# Patient Record
Sex: Male | Born: 1990 | Race: White | Marital: Married | Smoking: Never smoker
Health system: Southern US, Community
[De-identification: ages and names within clinical notes are randomized; demographics above are authoritative.]

## PROBLEM LIST (undated history)

## (undated) DIAGNOSIS — D696 Thrombocytopenia, unspecified: Secondary | ICD-10-CM

## (undated) HISTORY — PX: ANKLE SURGERY: SHX546

---

## 2019-12-24 ENCOUNTER — Other Ambulatory Visit: Payer: Self-pay | Admitting: Orthopaedic Surgery

## 2019-12-24 DIAGNOSIS — M19072 Primary osteoarthritis, left ankle and foot: Secondary | ICD-10-CM

## 2019-12-24 DIAGNOSIS — M25572 Pain in left ankle and joints of left foot: Secondary | ICD-10-CM

## 2019-12-25 ENCOUNTER — Ambulatory Visit
Admission: RE | Admit: 2019-12-25 | Discharge: 2019-12-25 | Disposition: A | Discharge status: Home / Self Care | Payer: 59 | Source: Ambulatory Visit | Attending: Orthopaedic Surgery | Admitting: Orthopaedic Surgery

## 2019-12-25 ENCOUNTER — Other Ambulatory Visit: Payer: Self-pay

## 2019-12-25 DIAGNOSIS — M25572 Pain in left ankle and joints of left foot: Secondary | ICD-10-CM

## 2019-12-25 DIAGNOSIS — M19072 Primary osteoarthritis, left ankle and foot: Secondary | ICD-10-CM

## 2020-01-13 ENCOUNTER — Other Ambulatory Visit: Payer: Self-pay

## 2020-01-13 ENCOUNTER — Other Ambulatory Visit: Payer: Self-pay | Admitting: Orthopaedic Surgery

## 2020-01-13 ENCOUNTER — Encounter (HOSPITAL_BASED_OUTPATIENT_CLINIC_OR_DEPARTMENT_OTHER): Payer: Self-pay | Admitting: Orthopaedic Surgery

## 2020-01-14 ENCOUNTER — Other Ambulatory Visit (HOSPITAL_COMMUNITY)
Admission: RE | Admit: 2020-01-14 | Discharge: 2020-01-14 | Disposition: A | Discharge status: Home / Self Care | Payer: Commercial Managed Care - PPO | Source: Ambulatory Visit | Attending: Orthopaedic Surgery | Admitting: Orthopaedic Surgery

## 2020-01-14 ENCOUNTER — Encounter (HOSPITAL_BASED_OUTPATIENT_CLINIC_OR_DEPARTMENT_OTHER)
Admission: RE | Admit: 2020-01-14 | Discharge: 2020-01-14 | Disposition: A | Discharge status: Home / Self Care | Payer: Commercial Managed Care - PPO | Source: Ambulatory Visit | Attending: Orthopaedic Surgery | Admitting: Orthopaedic Surgery

## 2020-01-14 DIAGNOSIS — Z01812 Encounter for preprocedural laboratory examination: Secondary | ICD-10-CM | POA: Diagnosis not present

## 2020-01-14 DIAGNOSIS — X58XXXA Exposure to other specified factors, initial encounter: Secondary | ICD-10-CM | POA: Diagnosis not present

## 2020-01-14 DIAGNOSIS — M25772 Osteophyte, left ankle: Secondary | ICD-10-CM | POA: Diagnosis not present

## 2020-01-14 DIAGNOSIS — Z20822 Contact with and (suspected) exposure to covid-19: Secondary | ICD-10-CM | POA: Insufficient documentation

## 2020-01-14 DIAGNOSIS — S92142A Displaced dome fracture of left talus, initial encounter for closed fracture: Secondary | ICD-10-CM | POA: Diagnosis not present

## 2020-01-14 DIAGNOSIS — Z886 Allergy status to analgesic agent status: Secondary | ICD-10-CM | POA: Diagnosis not present

## 2020-01-14 DIAGNOSIS — M899 Disorder of bone, unspecified: Secondary | ICD-10-CM | POA: Diagnosis not present

## 2020-01-14 LAB — CBC
HCT: 46.1 % (ref 39.0–52.0)
Hemoglobin: 15.6 g/dL (ref 13.0–17.0)
MCH: 28.8 pg (ref 26.0–34.0)
MCHC: 33.8 g/dL (ref 30.0–36.0)
MCV: 85.1 fL (ref 80.0–100.0)
Platelets: 177 10*3/uL (ref 150–400)
RBC: 5.42 MIL/uL (ref 4.22–5.81)
RDW: 12.2 % (ref 11.5–15.5)
WBC: 6.9 10*3/uL (ref 4.0–10.5)
nRBC: 0 % (ref 0.0–0.2)

## 2020-01-14 LAB — SARS CORONAVIRUS 2 (TAT 6-24 HRS): SARS Coronavirus 2: NEGATIVE

## 2020-01-14 NOTE — Anesthesia Preprocedure Evaluation (Addendum)
Anesthesia Evaluation  Patient identified by MRN, date of birth, ID band Patient awake    Reviewed: Allergy & Precautions, NPO status , Patient's Chart, lab work & pertinent test results  Airway Mallampati: II  TM Distance: >3 FB Neck ROM: Full    Dental no notable dental hx. (+) Teeth Intact, Dental Advisory Given   Pulmonary neg pulmonary ROS,    Pulmonary exam normal breath sounds clear to auscultation       Cardiovascular Exercise Tolerance: Good negative cardio ROS Normal cardiovascular exam Rhythm:Regular Rate:Normal     Neuro/Psych negative neurological ROS  negative psych ROS   GI/Hepatic negative GI ROS, Neg liver ROS,   Endo/Other  negative endocrine ROS  Renal/GU negative Renal ROS     Musculoskeletal negative musculoskeletal ROS (+)   Abdominal   Peds  Hematology negative hematology ROS (+)   Anesthesia Other Findings   Reproductive/Obstetrics                            Anesthesia Physical Anesthesia Plan  ASA: I  Anesthesia Plan: General   Post-op Pain Management:  Regional for Post-op pain   Induction:   PONV Risk Score and Plan: 3 and Treatment may vary due to age or medical condition, Ondansetron, Dexamethasone and Midazolam  Airway Management Planned: LMA  Additional Equipment: None  Intra-op Plan:   Post-operative Plan:   Informed Consent: I have reviewed the patients History and Physical, chart, labs and discussed the procedure including the risks, benefits and alternatives for the proposed anesthesia with the patient or authorized representative who has indicated his/her understanding and acceptance.     Dental advisory given  Plan Discussed with: CRNA and Anesthesiologist  Anesthesia Plan Comments: (LMA + L  popliteal block + L adductor)      Anesthesia Quick Evaluation

## 2020-01-14 NOTE — Progress Notes (Signed)

## 2020-01-15 ENCOUNTER — Encounter (HOSPITAL_BASED_OUTPATIENT_CLINIC_OR_DEPARTMENT_OTHER): Payer: Self-pay | Admitting: Orthopaedic Surgery

## 2020-01-15 ENCOUNTER — Ambulatory Visit (HOSPITAL_BASED_OUTPATIENT_CLINIC_OR_DEPARTMENT_OTHER): Payer: Commercial Managed Care - PPO | Admitting: Anesthesiology

## 2020-01-15 ENCOUNTER — Other Ambulatory Visit: Payer: Self-pay

## 2020-01-15 ENCOUNTER — Encounter (HOSPITAL_BASED_OUTPATIENT_CLINIC_OR_DEPARTMENT_OTHER)
Admission: RE | Disposition: A | Discharge status: Home / Self Care | Payer: Self-pay | Source: Home / Self Care | Attending: Orthopaedic Surgery

## 2020-01-15 ENCOUNTER — Ambulatory Visit (HOSPITAL_BASED_OUTPATIENT_CLINIC_OR_DEPARTMENT_OTHER)
Admission: RE | Admit: 2020-01-15 | Discharge: 2020-01-15 | Disposition: A | Discharge status: Home / Self Care | Payer: Commercial Managed Care - PPO | Attending: Orthopaedic Surgery | Admitting: Orthopaedic Surgery

## 2020-01-15 DIAGNOSIS — S92142A Displaced dome fracture of left talus, initial encounter for closed fracture: Secondary | ICD-10-CM | POA: Insufficient documentation

## 2020-01-15 DIAGNOSIS — M899 Disorder of bone, unspecified: Secondary | ICD-10-CM | POA: Insufficient documentation

## 2020-01-15 DIAGNOSIS — X58XXXA Exposure to other specified factors, initial encounter: Secondary | ICD-10-CM | POA: Insufficient documentation

## 2020-01-15 DIAGNOSIS — M25772 Osteophyte, left ankle: Secondary | ICD-10-CM | POA: Insufficient documentation

## 2020-01-15 DIAGNOSIS — Z886 Allergy status to analgesic agent status: Secondary | ICD-10-CM | POA: Insufficient documentation

## 2020-01-15 HISTORY — PX: ORIF ANKLE FRACTURE: SHX5408

## 2020-01-15 HISTORY — DX: Thrombocytopenia, unspecified: D69.6

## 2020-01-15 SURGERY — OPEN REDUCTION INTERNAL FIXATION (ORIF) ANKLE FRACTURE
Anesthesia: General | Site: Ankle | Laterality: Left

## 2020-01-15 MED ORDER — MIDAZOLAM HCL 2 MG/2ML IJ SOLN
2.0000 mg | Freq: Once | INTRAMUSCULAR | Status: AC
  Administered 2020-01-15: 12:00:00 2 mg via INTRAVENOUS

## 2020-01-15 MED ORDER — OXYCODONE HCL 5 MG/5ML PO SOLN: 5.0000 mg | Freq: Once | ORAL | Status: DC | PRN

## 2020-01-15 MED ORDER — DEXAMETHASONE SODIUM PHOSPHATE 10 MG/ML IJ SOLN
INTRAMUSCULAR | Status: DC | PRN
  Administered 2020-01-15: 10 mg via INTRAVENOUS

## 2020-01-15 MED ORDER — LACTATED RINGERS IV SOLN: INTRAVENOUS | Status: DC

## 2020-01-15 MED ORDER — FENTANYL CITRATE (PF) 100 MCG/2ML IJ SOLN
100.0000 ug | Freq: Once | INTRAMUSCULAR | Status: AC
  Administered 2020-01-15: 12:00:00 100 ug via INTRAVENOUS

## 2020-01-15 MED ORDER — FENTANYL CITRATE (PF) 100 MCG/2ML IJ SOLN
INTRAMUSCULAR | Status: AC
  Filled 2020-01-15: qty 2

## 2020-01-15 MED ORDER — ONDANSETRON HCL 4 MG/2ML IJ SOLN
INTRAMUSCULAR | Status: AC
  Filled 2020-01-15: qty 2

## 2020-01-15 MED ORDER — ACETAMINOPHEN 10 MG/ML IV SOLN: 1000.0000 mg | Freq: Once | INTRAVENOUS | Status: DC | PRN

## 2020-01-15 MED ORDER — OXYCODONE HCL 5 MG PO TABS: 5.0000 mg | ORAL_TABLET | ORAL | 0 refills | Status: AC | PRN

## 2020-01-15 MED ORDER — GLYCOPYRROLATE PF 0.2 MG/ML IJ SOSY
PREFILLED_SYRINGE | INTRAMUSCULAR | Status: AC
  Filled 2020-01-15: qty 1

## 2020-01-15 MED ORDER — CLONIDINE HCL (ANALGESIA) 100 MCG/ML EP SOLN
EPIDURAL | Status: DC | PRN
  Administered 2020-01-15: 100 ug

## 2020-01-15 MED ORDER — ARTIFICIAL TEARS OPHTHALMIC OINT
TOPICAL_OINTMENT | OPHTHALMIC | Status: AC
  Filled 2020-01-15: qty 3.5

## 2020-01-15 MED ORDER — FENTANYL CITRATE (PF) 100 MCG/2ML IJ SOLN
INTRAMUSCULAR | Status: DC | PRN
  Administered 2020-01-15: 100 ug via INTRAVENOUS

## 2020-01-15 MED ORDER — MIDAZOLAM HCL 2 MG/2ML IJ SOLN
INTRAMUSCULAR | Status: AC
  Filled 2020-01-15: qty 2

## 2020-01-15 MED ORDER — MIDAZOLAM HCL 2 MG/2ML IJ SOLN
INTRAMUSCULAR | Status: DC | PRN
  Administered 2020-01-15: 2 mg via INTRAVENOUS

## 2020-01-15 MED ORDER — LIDOCAINE HCL (CARDIAC) PF 100 MG/5ML IV SOSY
PREFILLED_SYRINGE | INTRAVENOUS | Status: DC | PRN
  Administered 2020-01-15: 100 mg via INTRATRACHEAL

## 2020-01-15 MED ORDER — LIDOCAINE 2% (20 MG/ML) 5 ML SYRINGE
INTRAMUSCULAR | Status: AC
  Filled 2020-01-15: qty 5

## 2020-01-15 MED ORDER — CEFAZOLIN SODIUM-DEXTROSE 2-4 GM/100ML-% IV SOLN
2.0000 g | INTRAVENOUS | Status: AC
  Administered 2020-01-15: 13:00:00 2 g via INTRAVENOUS

## 2020-01-15 MED ORDER — PROPOFOL 10 MG/ML IV BOLUS
INTRAVENOUS | Status: AC
  Filled 2020-01-15: qty 20

## 2020-01-15 MED ORDER — OXYCODONE HCL 5 MG PO TABS: 5.0000 mg | ORAL_TABLET | Freq: Once | ORAL | Status: DC | PRN

## 2020-01-15 MED ORDER — ONDANSETRON HCL 4 MG/2ML IJ SOLN: 4.0000 mg | Freq: Once | INTRAMUSCULAR | Status: DC | PRN

## 2020-01-15 MED ORDER — ROPIVACAINE HCL 5 MG/ML IJ SOLN
INTRAMUSCULAR | Status: DC | PRN
  Administered 2020-01-15: 10 mL via PERINEURAL

## 2020-01-15 MED ORDER — EPHEDRINE SULFATE 50 MG/ML IJ SOLN
INTRAMUSCULAR | Status: DC | PRN
  Administered 2020-01-15 (×2): 10 mg via INTRAVENOUS

## 2020-01-15 MED ORDER — ONDANSETRON HCL 4 MG/2ML IJ SOLN
INTRAMUSCULAR | Status: DC | PRN
  Administered 2020-01-15: 4 mg via INTRAVENOUS

## 2020-01-15 MED ORDER — HYDROMORPHONE HCL 1 MG/ML IJ SOLN: 0.2500 mg | INTRAMUSCULAR | Status: DC | PRN

## 2020-01-15 MED ORDER — DEXAMETHASONE SODIUM PHOSPHATE 10 MG/ML IJ SOLN
INTRAMUSCULAR | Status: AC
  Filled 2020-01-15: qty 1

## 2020-01-15 MED ORDER — PROPOFOL 10 MG/ML IV BOLUS
INTRAVENOUS | Status: DC | PRN
  Administered 2020-01-15: 200 mg via INTRAVENOUS

## 2020-01-15 MED ORDER — ROPIVACAINE HCL 7.5 MG/ML IJ SOLN
INTRAMUSCULAR | Status: DC | PRN
  Administered 2020-01-15: 20 mL via PERINEURAL

## 2020-01-15 MED ORDER — CEFAZOLIN SODIUM-DEXTROSE 2-4 GM/100ML-% IV SOLN
INTRAVENOUS | Status: AC
  Filled 2020-01-15: qty 100

## 2020-01-15 MED ORDER — MEPERIDINE HCL 25 MG/ML IJ SOLN: 6.2500 mg | INTRAMUSCULAR | Status: DC | PRN

## 2020-01-15 MED ORDER — GLYCOPYRROLATE 0.2 MG/ML IJ SOLN
INTRAMUSCULAR | Status: DC | PRN
  Administered 2020-01-15: .2 mg via INTRAVENOUS

## 2020-01-15 SURGICAL SUPPLY — 63 items
ANCH SUT 0 2DMD BSUT TK 14.5X3 (Anchor) ×1 IMPLANT
ANCHOR BIO-SUTURETAK 0 FWIRE (Anchor) ×2 IMPLANT
APL PRP STRL LF DISP 70% ISPRP (MISCELLANEOUS) ×1
APL SKNCLS STERI-STRIP NONHPOA (GAUZE/BANDAGES/DRESSINGS)
BANDAGE ESMARK 6X9 LF (GAUZE/BANDAGES/DRESSINGS) ×1 IMPLANT
BENZOIN TINCTURE PRP APPL 2/3 (GAUZE/BANDAGES/DRESSINGS) IMPLANT
BLADE SURG 15 STRL LF DISP TIS (BLADE) ×2 IMPLANT
BLADE SURG 15 STRL SS (BLADE) ×4
BNDG CMPR 9X6 STRL LF SNTH (GAUZE/BANDAGES/DRESSINGS) ×1
BNDG COHESIVE 4X5 TAN STRL (GAUZE/BANDAGES/DRESSINGS) IMPLANT
BNDG ELASTIC 6X5.8 VLCR STR LF (GAUZE/BANDAGES/DRESSINGS) ×4 IMPLANT
BNDG ESMARK 6X9 LF (GAUZE/BANDAGES/DRESSINGS) ×2
CHLORAPREP W/TINT 26 (MISCELLANEOUS) ×2 IMPLANT
COVER BACK TABLE 60X90IN (DRAPES) ×2 IMPLANT
COVER WAND RF STERILE (DRAPES) IMPLANT
CUFF TOURN SGL QUICK 34 (TOURNIQUET CUFF) ×2
CUFF TRNQT CYL 34X4.125X (TOURNIQUET CUFF) ×1 IMPLANT
DECANTER SPIKE VIAL GLASS SM (MISCELLANEOUS) IMPLANT
DRAPE C-ARM 42X72 X-RAY (DRAPES) IMPLANT
DRAPE C-ARMOR (DRAPES) IMPLANT
DRAPE EXTREMITY T 121X128X90 (DISPOSABLE) ×2 IMPLANT
DRAPE IMP U-DRAPE 54X76 (DRAPES) ×2 IMPLANT
DRAPE U-SHAPE 47X51 STRL (DRAPES) ×2 IMPLANT
ELECT REM PT RETURN 9FT ADLT (ELECTROSURGICAL) ×2
ELECTRODE REM PT RTRN 9FT ADLT (ELECTROSURGICAL) ×1 IMPLANT
GAUZE SPONGE 4X4 12PLY STRL (GAUZE/BANDAGES/DRESSINGS) ×2 IMPLANT
GAUZE XEROFORM 1X8 LF (GAUZE/BANDAGES/DRESSINGS) ×2 IMPLANT
GLOVE SRG 8 PF TXTR STRL LF DI (GLOVE) ×1 IMPLANT
GLOVE SURG ENC TEXT LTX SZ7.5 (GLOVE) ×2 IMPLANT
GLOVE SURG UNDER POLY LF SZ8 (GLOVE) ×2
GOWN STRL REUS W/ TWL LRG LVL3 (GOWN DISPOSABLE) ×1 IMPLANT
GOWN STRL REUS W/ TWL XL LVL3 (GOWN DISPOSABLE) ×1 IMPLANT
GOWN STRL REUS W/TWL LRG LVL3 (GOWN DISPOSABLE) ×2
GOWN STRL REUS W/TWL XL LVL3 (GOWN DISPOSABLE) ×2
KIT SUTURETAK 2.4 DRILL BIT (KITS) ×2 IMPLANT
NS IRRIG 1000ML POUR BTL (IV SOLUTION) ×2 IMPLANT
PACK BASIN DAY SURGERY FS (CUSTOM PROCEDURE TRAY) ×2 IMPLANT
PAD CAST 4YDX4 CTTN HI CHSV (CAST SUPPLIES) ×1 IMPLANT
PADDING CAST COTTON 4X4 STRL (CAST SUPPLIES) ×2
PADDING CAST SYNTHETIC 4 (CAST SUPPLIES) ×2
PADDING CAST SYNTHETIC 4X4 STR (CAST SUPPLIES) ×2 IMPLANT
PENCIL SMOKE EVACUATOR (MISCELLANEOUS) ×2 IMPLANT
SHEET MEDIUM DRAPE 40X70 STRL (DRAPES) ×2 IMPLANT
SLEEVE SCD COMPRESS KNEE MED (MISCELLANEOUS) ×2 IMPLANT
SPLINT FAST PLASTER 5X30 (CAST SUPPLIES) ×20
SPLINT PLASTER CAST FAST 5X30 (CAST SUPPLIES) ×20 IMPLANT
SPONGE LAP 18X18 RF (DISPOSABLE) IMPLANT
STAPLER VISISTAT 35W (STAPLE) IMPLANT
STOCKINETTE 6  STRL (DRAPES) ×1
STOCKINETTE 6 STRL (DRAPES) ×1 IMPLANT
STRIP CLOSURE SKIN 1/2X4 (GAUZE/BANDAGES/DRESSINGS) IMPLANT
SUCTION FRAZIER HANDLE 10FR (MISCELLANEOUS) ×2
SUCTION TUBE FRAZIER 10FR DISP (MISCELLANEOUS) ×2 IMPLANT
SUT ETHILON 3 0 PS 1 (SUTURE) ×2 IMPLANT
SUT MNCRL AB 3-0 PS2 18 (SUTURE) ×2 IMPLANT
SUT PDS AB 2-0 CT2 27 (SUTURE) ×2 IMPLANT
SUT VIC AB 2-0 SH 27 (SUTURE) ×2
SUT VIC AB 2-0 SH 27XBRD (SUTURE) ×1 IMPLANT
SUT VIC AB 3-0 FS2 27 (SUTURE) IMPLANT
SYR BULB EAR ULCER 3OZ GRN STR (SYRINGE) ×2 IMPLANT
TOWEL GREEN STERILE FF (TOWEL DISPOSABLE) ×4 IMPLANT
TUBE CONNECTING 20X1/4 (TUBING) ×2 IMPLANT
UNDERPAD 30X36 HEAVY ABSORB (UNDERPADS AND DIAPERS) ×2 IMPLANT

## 2020-01-15 NOTE — Anesthesia Procedure Notes (Signed)
Procedure Name: LMA Insertion Date/Time: 01/15/2020 1:00 PM Performed by: Salomon Mast, CRNA Pre-anesthesia Checklist: Patient identified, Emergency Drugs available, Suction available and Patient being monitored Patient Re-evaluated:Patient Re-evaluated prior to induction Oxygen Delivery Method: Circle system utilized Preoxygenation: Pre-oxygenation with 100% oxygen Induction Type: IV induction LMA: LMA inserted LMA Size: 4.0 Number of attempts: 1 Placement Confirmation: positive ETCO2 and breath sounds checked- equal and bilateral Tube secured with: Tape

## 2020-01-15 NOTE — H&P (Signed)
PREOPERATIVE H&P  Chief Complaint: Left ankle pain  HPI: Lee Hartman is a 29 y.o. male who presents for preoperative history and physical with a diagnosis of lateral process of the talus fracture with continued pain.  Also had some anteromedial impingement with osteophytes of the distal tibia and dorsal talus.  He is here today for surgical correction of these problems.  He failed conservative treatment with physical therapy which exacerbated his symptoms, boot immobilization, anti-inflammatories and activity modifications.  He had continued pain.  He did undergo lateral ligament reconstruction ankle scope and did quite well from this.  During his rehab process he was noted to have worsening pain.. Symptoms are rated as moderate to severe, and have been worsening.  This is significantly impairing activities of daily living.  He has elected for surgical management.   Past Medical History:  Diagnosis Date   Thrombocytopenia (HCC)    Past Surgical History:  Procedure Laterality Date   ANKLE SURGERY     Social History   Socioeconomic History   Marital status: Married    Spouse name: Not on file   Number of children: Not on file   Years of education: Not on file   Highest education level: Not on file  Occupational History   Not on file  Tobacco Use   Smoking status: Never Smoker   Smokeless tobacco: Never Used  Vaping Use   Vaping Use: Never used  Substance and Sexual Activity   Alcohol use: Never   Drug use: Never   Sexual activity: Not on file  Other Topics Concern   Not on file  Social History Narrative   Not on file   Social Determinants of Health   Financial Resource Strain: Not on file  Food Insecurity: Not on file  Transportation Needs: Not on file  Physical Activity: Not on file  Stress: Not on file  Social Connections: Not on file   History reviewed. No pertinent family history. Allergies  Allergen Reactions   Aspirin Other (See Comments)    H/o  thrombocytopenia, avoids NSAIDS also   Prior to Admission medications   Medication Sig Start Date End Date Taking? Authorizing Provider  montelukast (SINGULAIR) 10 MG tablet Take 10 mg by mouth at bedtime.   Yes [provider]     Positive ROS: All other systems have been reviewed and were otherwise negative with the exception of those mentioned in the HPI and as above.  Physical Exam:  Vitals:   01/15/20 1225 01/15/20 1230  BP: 103/61 114/65  Pulse: 60 63  Resp: 15 18  Temp:    SpO2: 100% 100%   General: Alert, no acute distress Cardiovascular: No pedal edema Respiratory: No cyanosis, no use of accessory musculature GI: No organomegaly, abdomen is soft and non-tender Skin: No lesions in the area of chief complaint Neurologic: Sensation intact distally Psychiatric: Patient is competent for consent with normal mood and affect Lymphatic: No axillary or cervical lymphadenopathy  MUSCULOSKELETAL: Left ankle demonstrates no swelling.  He has tenderness palpation of the anteromedial ankle joint.  Also has some tenderness to palpation distal to the fibula about the lateral process of the talus area.  He has good ankle motion.  Good ankle stability.  Forced dorsiflexion causes pain in the anteromedial ankle.  Some lateral motion of the hindfoot causes discomfort laterally.  Foot is warm and well-perfused.  Intact sensation distally.  Assessment: Left lateral talar process fracture Anteromedial ankle impingement with dorsal talar osteophyte and distal medial tibia  osteophyte   Plan: Plan for open treatment of his talus fracture with internal fixation versus resection of the fragment Ankle arthrotomy and removal of medial distal tibial osteophyte and dorsal talar osteophyte..  We discussed the risks, benefits and alternatives of surgery which include but are not limited to wound healing complications, infection, nonunion, malunion, need for further surgery, damage to  surrounding structures and continued pain.  They understand there is no guarantees to an acceptable outcome.  After weighing these risks they opted to proceed with surgery.     Terance Hart, MD    01/15/2020 12:45 PM

## 2020-01-15 NOTE — Anesthesia Procedure Notes (Signed)
Anesthesia Regional Block: Popliteal block   Pre-Anesthetic Checklist: ,, timeout performed, Correct Patient, Correct Site, Correct Laterality, Correct Procedure, Correct Position, site marked, Risks and benefits discussed, pre-op evaluation,  At surgeon's request and post-op pain management  Laterality: Left and Lower  Prep: Maximum Sterile Barrier Precautions used, chloraprep       Needles:  Injection technique: Single-shot  Needle Type: Echogenic Needle     Needle Length: 9cm  Needle Gauge: 21     Additional Needles:   Procedures:,,,, ultrasound used (permanent image in chart),,,,  Narrative:  Start time: 01/15/2020 12:15 PM End time: 01/15/2020 12:22 PM Injection made incrementally with aspirations every 5 mL.  Performed by: Personally  Anesthesiologist: Trevor Iha, MD  Additional Notes: Block assessed. Patient tolerated procedure well.

## 2020-01-15 NOTE — Discharge Instructions (Signed)
DR. Susa Simmonds FOOT [facility] ANKLE SURGERY POST-OP INSTRUCTIONS   Pain Management 1. The numbing medicine and your leg will last around 18 hours, take a dose of your pain medicine as soon as you feel it wearing off to avoid rebound pain. 2. Keep your foot elevated above heart level.  Make sure that your heel hangs free ('floats'). 3. Take all prescribed medication as directed. 4. If taking narcotic pain medication you may want to use an over-the-counter stool softener to avoid constipation. 5. You may take over-the-counter NSAIDs (ibuprofen, naproxen, etc.) as well as over-the-counter acetaminophen as directed on the packaging as a supplement for your pain and may also use it to wean away from the prescription medication.  Activity ? Weightbearing as tolerated  ? Keep dressing intact  First Postoperative Visit 1. Your first postop visit will be at least 2 weeks after surgery.  This should be scheduled when you schedule surgery. 2. If you do not have a postoperative visit scheduled please call [phone] to schedule an appointment. 3. At the appointment your incision will be evaluated for suture removal, x-rays will be obtained if necessary.  General Instructions 1. Swelling is very common after foot and ankle surgery.  It often takes 3 months for the foot and ankle to begin to feel comfortable.  Some amount of swelling will persist for 6-12 months. 2. DO NOT change the dressing.  If there is a problem with the dressing (too tight, loose, gets wet, etc.) please contact Dr. Donnie Mesa office. 3. DO NOT get the dressing wet.  For showers you can use an over-the-counter cast cover or wrap a washcloth around the top of your dressing and then cover it with a plastic bag and tape it to your leg. 4. DO NOT soak the incision (no tubs, pools, bath, etc.) until you have approval from Dr. Susa Simmonds.  Contact Dr. Garret Reddish office or go to Emergency Room if: 1. Temperature above 101 F. 2. Increasing pain that is  unresponsive to pain medication or elevation 3. Excessive redness or swelling in your foot 4. Dressing problems - excessive bloody drainage, looseness or tightness, or if dressing gets wet 5. Develop pain, swelling, warmth, or discoloration of your calf   Post Anesthesia Home Care Instructions  Activity: Get plenty of rest for the remainder of the day. A responsible individual must stay with you for 24 hours following the procedure.  For the next 24 hours, DO NOT: -Drive a car -Advertising copywriter -Drink alcoholic beverages -Take any medication unless instructed by your physician -Make any legal decisions or sign important papers.  Meals: Start with liquid foods such as gelatin or soup. Progress to regular foods as tolerated. Avoid greasy, spicy, heavy foods. If nausea and/or vomiting occur, drink only clear liquids until the nausea and/or vomiting subsides. Call your physician if vomiting continues.  Special Instructions/Symptoms: Your throat may feel dry or sore from the anesthesia or the breathing tube placed in your throat during surgery. If this causes discomfort, gargle with warm salt water. The discomfort should disappear within 24 hours.  If you had a scopolamine patch placed behind your ear for the management of post- operative nausea and/or vomiting:  1. The medication in the patch is effective for 72 hours, after which it should be removed.  Wrap patch in a tissue and discard in the trash. Wash hands thoroughly with soap and water. 2. You may remove the patch earlier than 72 hours if you experience unpleasant side effects which may include  dry mouth, dizziness or visual disturbances. 3. Avoid touching the patch. Wash your hands with soap and water after contact with the patch.    Regional Anesthesia Blocks  1. Numbness or the inability to move the "blocked" extremity may last from 3-48 hours after placement. The length of time depends on the medication injected and your  individual response to the medication. If the numbness is not going away after 48 hours, call your surgeon.  2. The extremity that is blocked will need to be protected until the numbness is gone and the  Strength has returned. Because you cannot feel it, you will need to take extra care to avoid injury. Because it may be weak, you may have difficulty moving it or using it. You may not know what position it is in without looking at it while the block is in effect.  3. For blocks in the legs and feet, returning to weight bearing and walking needs to be done carefully. You will need to wait until the numbness is entirely gone and the strength has returned. You should be able to move your leg and foot normally before you try and bear weight or walk. You will need someone to be with you when you first try to ensure you do not fall and possibly risk injury.  4. Bruising and tenderness at the needle site are common side effects and will resolve in a few days.  5. Persistent numbness or new problems with movement should be communicated to the surgeon or the [facility] [phone] [facility] [phone]).

## 2020-01-15 NOTE — Anesthesia Procedure Notes (Signed)
Anesthesia Regional Block: Adductor canal block   Pre-Anesthetic Checklist: ,, timeout performed, Correct Patient, Correct Site, Correct Laterality, Correct Procedure, Correct Position, site marked, Risks and benefits discussed,  Surgical consent,  Pre-op evaluation,  At surgeon's request and post-op pain management  Laterality: Lower and Left  Prep: chloraprep       Needles:  Injection technique: Single-shot  Needle Type: Echogenic Needle     Needle Length: 9cm  Needle Gauge: 22     Additional Needles:   Procedures:,,,, ultrasound used (permanent image in chart),,,,  Narrative:  Start time: 01/15/2020 12:22 PM End time: 01/15/2020 12:29 PM Injection made incrementally with aspirations every 5 mL.  Performed by: Personally  Anesthesiologist: Trevor Iha, MD  Additional Notes: Block assessed prior to surgery. Pt tolerated procedure well.

## 2020-01-15 NOTE — Anesthesia Postprocedure Evaluation (Signed)
Anesthesia Post Note  Patient: Lee Hartman  Procedure(s) Performed: OPEN TREATMENT OF LEFT TALUS FRACTURE, RESECTION OF TIBIA AND TALUS (Left Ankle)     Patient location during evaluation: PACU Anesthesia Type: General Level of consciousness: awake and alert and oriented Pain management: pain level controlled Vital Signs Assessment: post-procedure vital signs reviewed and stable Respiratory status: spontaneous breathing, nonlabored ventilation and respiratory function stable Cardiovascular status: blood pressure returned to baseline and stable Postop Assessment: no apparent nausea or vomiting Anesthetic complications: no   No complications documented.  Last Vitals:  Vitals:   01/15/20 1415 01/15/20 1430  BP: (!) 102/59 (!) 108/50  Pulse: 62 64  Resp: 13 15  Temp:    SpO2: 100% 94%    Last Pain:  Vitals:   01/15/20 1430  TempSrc:   PainSc: 0-No pain                 Sanyiah Kanzler A.

## 2020-01-15 NOTE — Transfer of Care (Signed)
Immediate Anesthesia Transfer of Care Note  Patient: Lee Hartman  Procedure(s) Performed: OPEN TREATMENT OF LEFT TALUS FRACTURE, RESECTION OF TIBIA AND TALUS (Left Ankle)  Patient Location: PACU  Anesthesia Type:General and Regional  Level of Consciousness: awake and patient cooperative  Airway & Oxygen Therapy: Patient Spontanous Breathing and Patient connected to face mask oxygen  Post-op Assessment: Report given to RN, Post -op Vital signs reviewed and stable, Patient moving all extremities X 4 and Patient able to stick tongue midline  Post vital signs: Reviewed and stable  Last Vitals:  Vitals Value Taken Time  BP    Temp    Pulse 71 01/15/20 1412  Resp 11 01/15/20 1412  SpO2 99 % 01/15/20 1412  Vitals shown include unvalidated device data.  Last Pain:  Vitals:   01/15/20 1207  TempSrc: Oral  PainSc: 0-No pain      Patients Stated Pain Goal: 4 (01/15/20 1207)  Complications: No complications documented.

## 2020-01-15 NOTE — Progress Notes (Signed)
AssistedDr. Houser with left, ultrasound guided, popliteal, adductor canal block. Side rails up, monitors on throughout procedure. See vital signs in flow sheet. Tolerated Procedure well.  

## 2020-01-15 NOTE — Op Note (Signed)
Lee Hartman male 29 y.o. 01/15/2020  PreOperative Diagnosis: Left subacute lateral talar fracture Anteromedial ankle impingement with synovitis Anteromedial distal tibial exostosis Dorsal talar neck exostosis  PostOperative Diagnosis: Same  PROCEDURE: Open treatment of left talus fracture Ankle arthrotomy and synovectomy Partial excision of distal tibia Partial excision of talus  SURGEON: Dub Mikes, MD  ASSISTANT: None  ANESTHESIA: General LMA with peripheral nerve blockade  FINDINGS: See below  IMPLANTS: Arthrex suture tack  INDICATIONS:29 y.o. male sustained an ankle injury greater than 1 year ago.  He had continued pain and underwent lateral ligament reconstructive surgery.  He did well with this but during rehab developed pain on the lateral aspect of his foot.  CT scan revealed a subacute fracture of his talus bone.  He was having pain in this area with hindfoot inversion and eversion.  He was also having pain in the anteromedial aspect of his ankle consistent with anteromedial impingement and had evidence of osteophyte formation of the distal medial tibia and dorsal talar neck at the level of his pain.  Given failure of conservative treatment he was indicated for surgery as above.   Patient understood the risks, benefits and alternatives to surgery which include but are not limited to wound healing complications, infection, nonunion, malunion, need for further surgery as well as damage to surrounding structures. They also understood the potential for continued pain in that there were no guarantees of acceptable outcome After weighing these risks the patient opted to proceed with surgery.  PROCEDURE: Patient was identified in the preoperative holding area.  The left leg was marked by myself.  Consent was signed by myself and the patient.  Block was performed by anesthesia in the preoperative holding area.  Patient was taken to the operative suite and placed supine on  the operative table.  General LMA anesthesia was induced without difficulty. Bump was placed under the operative hip and bone foam was used.  All bony prominences were well padded.  Tourniquet was placed on the operative thigh.  Preoperative antibiotics were given. The extremity was prepped and draped in the usual sterile fashion and surgical timeout was performed.  The limb was elevated and the tourniquet was inflated to 250 mmHg.  We began by making a longitudinal incision just distal to the prior incision made for his lateral ligament reconstruction.  This taken sharply down through skin and subcutaneous tissue.  Blunt dissection was used to mobilize skin flaps.  The extensor digitorum brevis muscle belly was identified.  The fascia overlying the muscle belly was incised in line with the incision.  The level of the talus fracture was identified and an incision was made through the deep tissue to gain access to this portion of the talus.  Blunt dissection was used to mobilize the tissue overlying the fracture.  The fracture site was identified.  The soft tissue was mobilized overlying the fracture site.  The fracture fragment was rather small and therefore it was decided for excision of the piece rather than internal fixation.  The fracture fragment was further mobilized and elevated from the underlying and overlying soft tissue.  The fracture fragment was ultimately removed.  This area fracture was further inspected and there was some ectopic and heterotopic bone in this area that was cleared off with a rondure to allow for smooth ankle motion.  The ankle was taken through range of motion to ensure no bony impingement in this area.  There was none.  Then the lateral ligaments that previously  been repaired were tied back to the area of the talus fracture using a suture tack.  A drill tunnel was made and the tack was placed within the lateral aspect of the talus at the insertion site of the ATFL.  The tendons  were tied back to the bone.  There is no gross instability at any point.  We then turned our attention to the medial ankle.  An incision was made overlying the medial ankle joint.  Was taken sharply down through skin and subcutaneous tissue and was made just medial to the tibialis anterior tendon.  The tendon was mobilized.  The tissue medial to the tibialis anterior was incised in line with the incision to gain access to the joint capsule.  The ankle joint capsule was identified and an arthrotomy was created in the anteromedial ankle joint.  There is some synovial fluid that exuded.  The soft tissue was mobilized further to gain access to the anterior portion of the distal tibia and anteromedial aspect of the distal tibia.  There was severe scarring and synovitic tissue in this area within the ankle joint.  This synovectomy was performed with sharp dissection using a 15 blade and pickups.  The ankle was then taken through range of motion and was found to be bony impingement of the distal medial aspect of the tibia and talar neck.  Using an osteotome partial resection of the distal tibia was performed.  This was done to the anterior distal aspect of the tibia from the medial malleolus over to the central area of the joint.  Then the fragments were removed with a rondure.  Then the ankle was taken through range of motion and there was noted to be continued prominence of the dorsal talus.  Using a rondure partial resection of the dorsal talar neck region was performed.  The bony prominence was removed and smoothed back to normal anatomic contour.  The ankle was then taken through range of motion and noted to be without evidence of any bony or soft tissue impingement in the anteromedial ankle joint.  The ankle was then stressed again and found to be grossly stable.  The ankle joint was irrigated copiously with normal saline.  The wounds were then irrigated copiously with normal saline.  The ankle joint capsule  and deep tissue was closed using a 2-0 Vicryl suture.  The skin and subcuticular tissue was closed in a layered fashion using 3-0 Monocryl and 3-0 nylon.  Soft dressing was placed.  Tourniquet was released.  He was awakened from anesthesia and taken recovery in stable condition.  There are no complications.    POST OPERATIVE INSTRUCTIONS: Weightbearing as tolerated operative extremity Wear walking boot as needed for pain Follow-up in 2 weeks for suture removal.  No x-rays needed. Call the office with concerns.  TOURNIQUET TIME: Less than 2 hours  BLOOD LOSS:  Minimal         DRAINS: none         SPECIMEN: none       COMPLICATIONS:  * No complications entered in OR log *         Disposition: PACU - hemodynamically stable.         Condition: stable

## 2020-01-19 ENCOUNTER — Encounter (HOSPITAL_BASED_OUTPATIENT_CLINIC_OR_DEPARTMENT_OTHER): Payer: Self-pay | Admitting: Orthopaedic Surgery

## 2020-01-19 NOTE — Addendum Note (Signed)
Addendum  created 01/19/20 0802 by Kellina Dreese, Jewel Baize, CRNA   Charge Capture section accepted

## 2021-11-29 IMAGING — CT CT ANKLE*L* W/O CM
2 series · 13 of 29 positions shown, 16 images · non-contrast
Comparison: MRI left ankle dated June 23, 2019. Left ankle x-rays
dated March 28, 2019.

CLINICAL DATA: Chronic anterior ankle pain.  Prior surgery in [REDACTED].

EXAM:
CT OF THE LEFT ANKLE WITHOUT CONTRAST
TECHNIQUE: Multidetector CT imaging of the left ankle was performed according
to the standard protocol. Multiplanar CT image reconstructions were
also generated.

[Series 4: soft tissue lower extremity · axial · 0.32mm/px · z∈[-257,-71]mm · 8 of 111 slices shown, 10 images]
[im 9/111  soft-tissue]
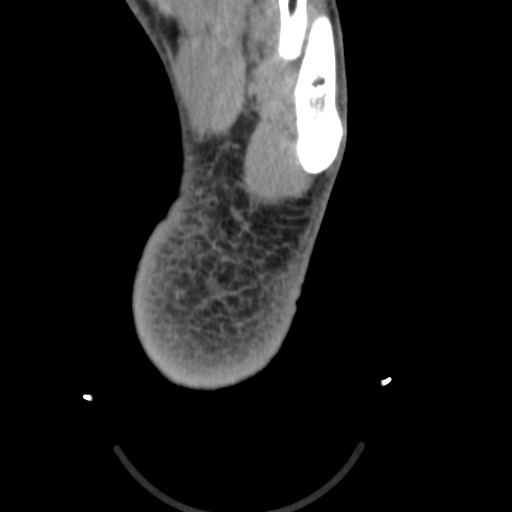
[im 9/111  bone]
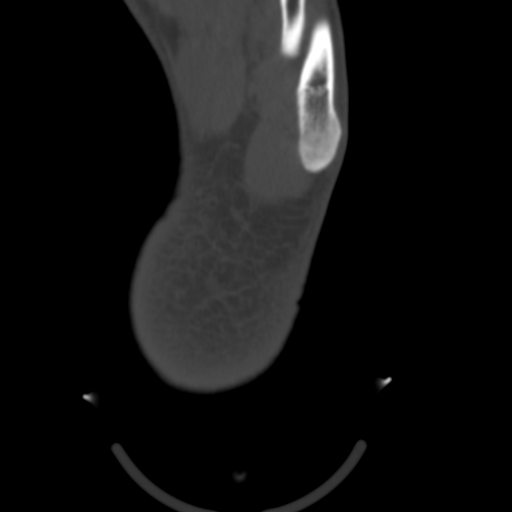
[im 26/111  bone]
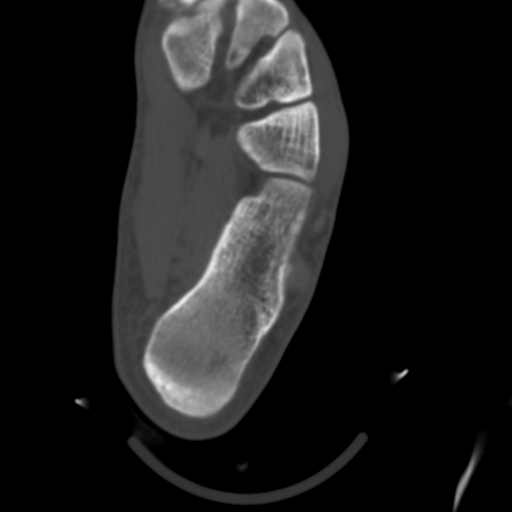
[im 34/111  bone]
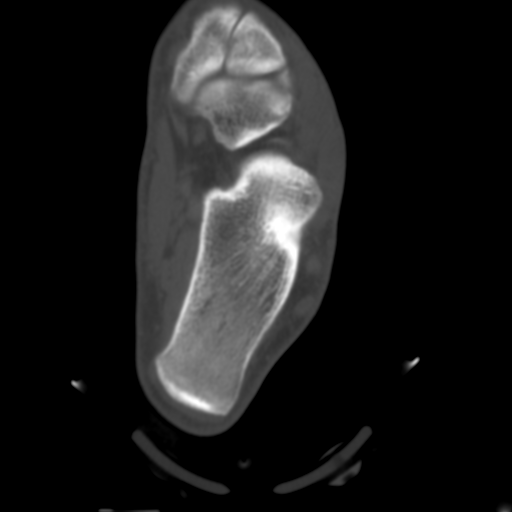
[im 51/111  bone]
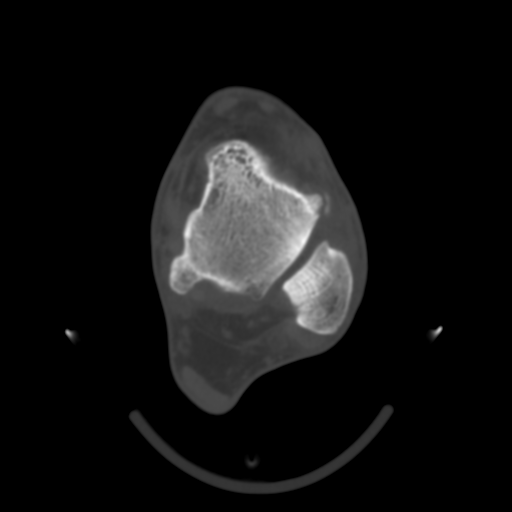
[im 60/111  soft-tissue]
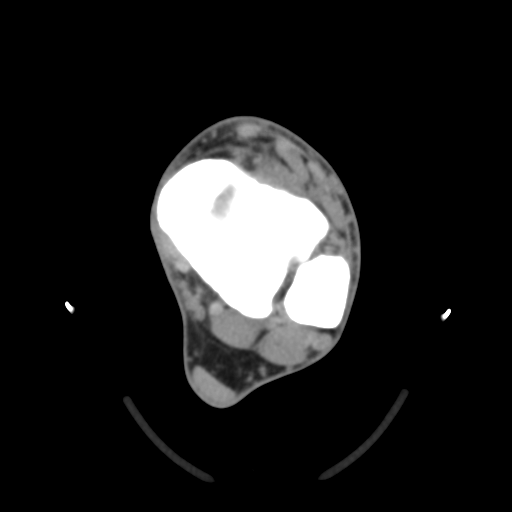
[im 60/111  bone]
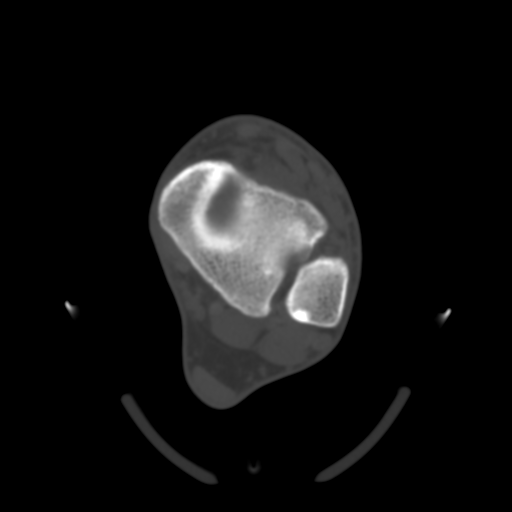
[im 77/111  bone]
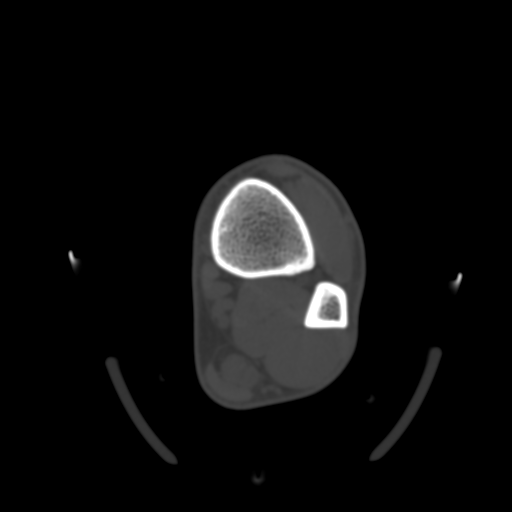
[im 85/111  bone]
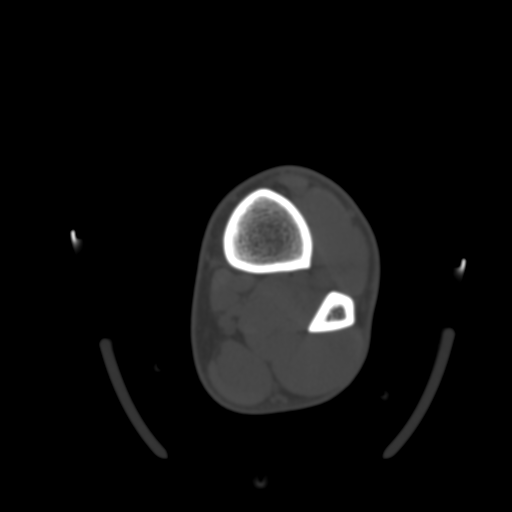
[im 102/111  bone]
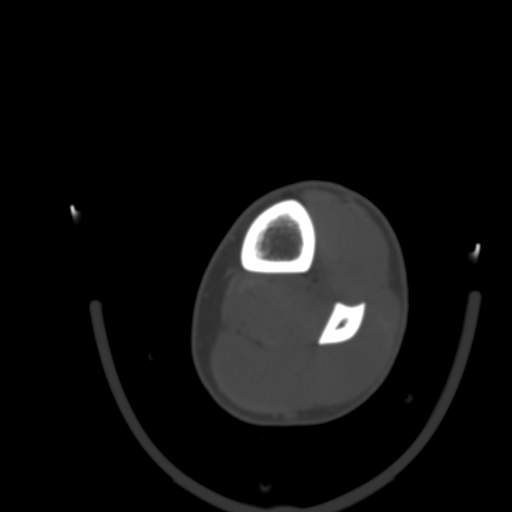

[Series 10: sagsoft tissue · sagittal · 0.29mm/px · 5 of 77 slices shown, 6 images]
[im 26/77  bone]
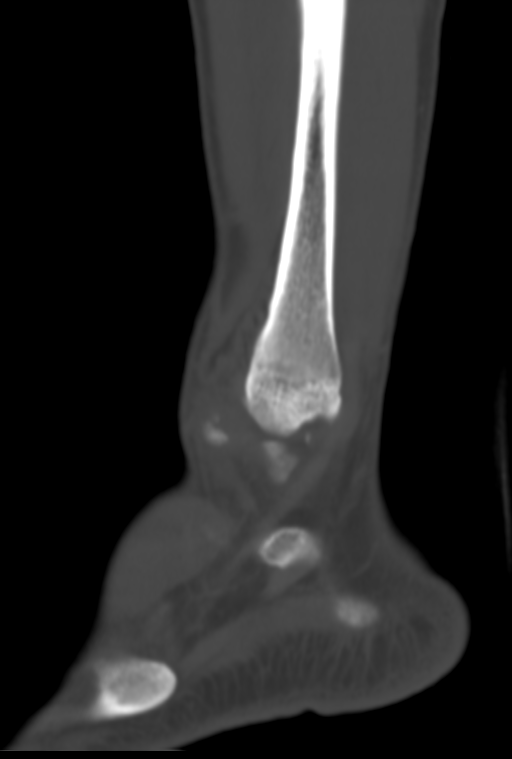
[im 32/77  bone]
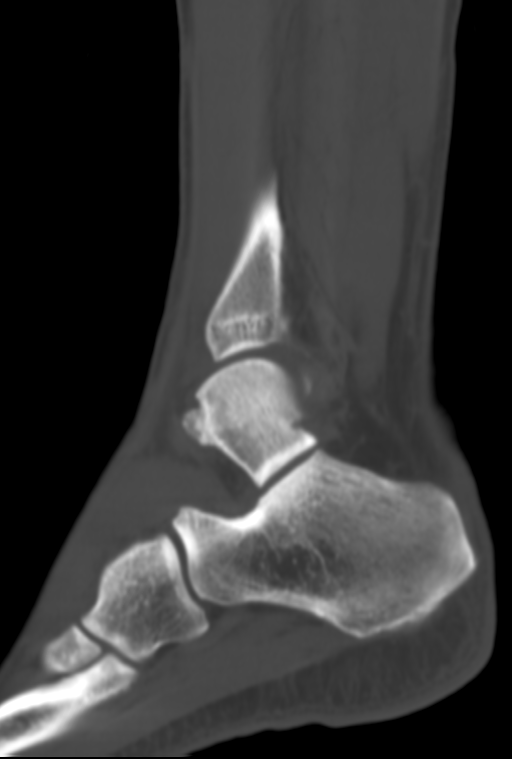
[im 39/77  soft-tissue]
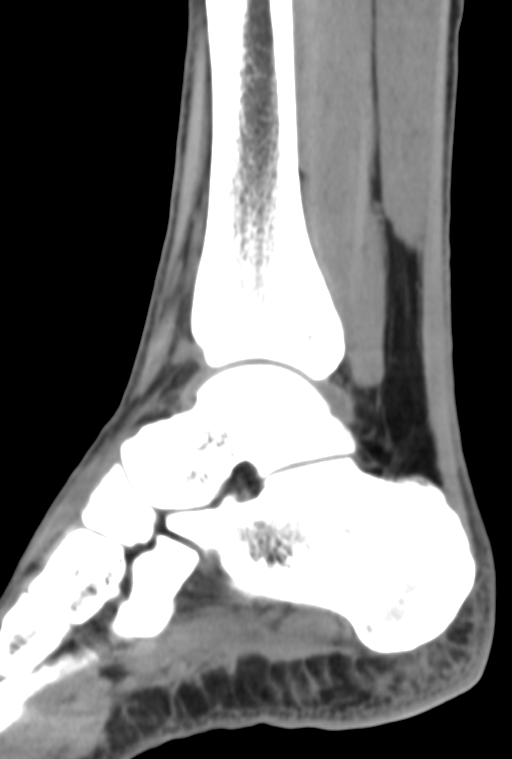
[im 39/77  bone]
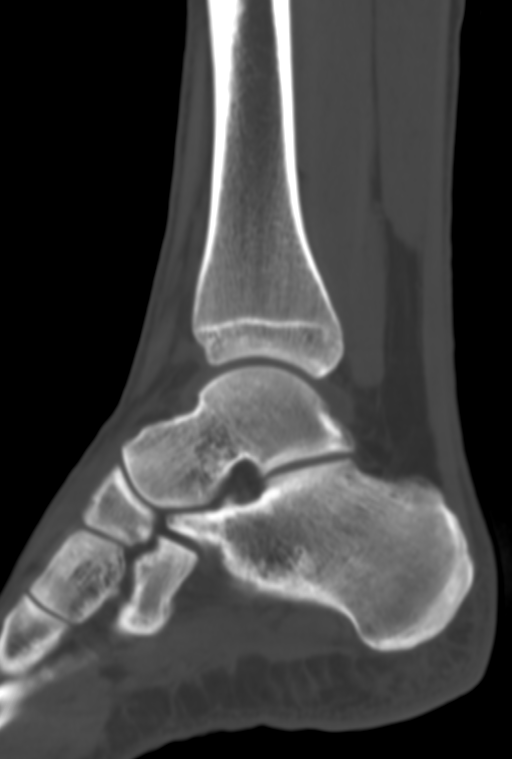
[im 45/77  bone]
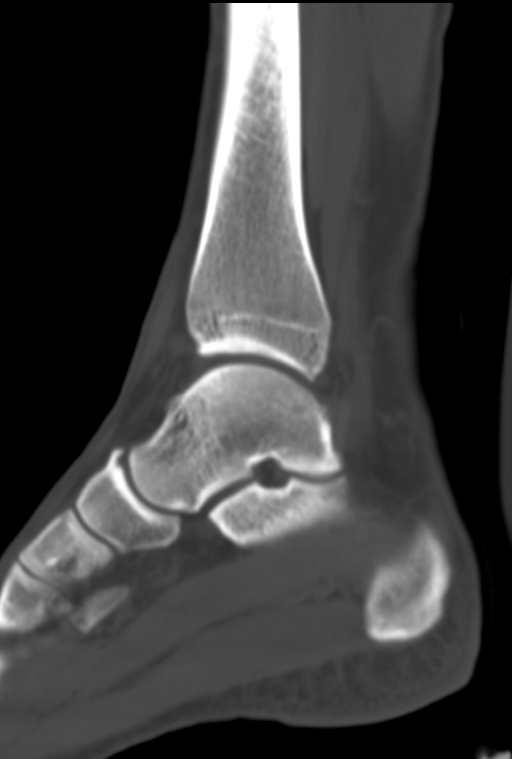
[im 51/77  bone]
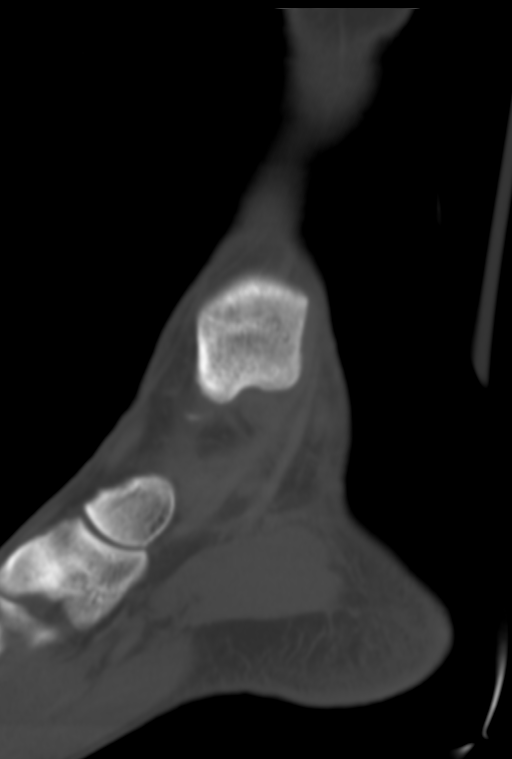

[13 of 29 positions shown; findings below may reference images not displayed]

FINDINGS: Bones/Joint/Cartilage

Chronic avulsion fracture of the lateral talus with heterotopic
ossification in the (series 3, image 64). No acute fracture or
dislocation. The ankle mortise is symmetric. The talar dome is
intact. Joint spaces are preserved. Suture anchor in the anterior
tip of the lateral malleolus. No joint effusion.

Ligaments

Ligaments are suboptimally evaluated by CT, although the anterior
talofibular ligament appears thickened as on prior MRI.

Muscles and Tendons
Grossly intact.  No muscle atrophy.

Soft tissue
No fluid collection or hematoma.  No soft tissue mass.
IMPRESSION: 1. Chronic avulsion fracture of the lateral talus at the anterior
talofibular ligament attachment with heterotopic ossification.

## 2023-01-17 ENCOUNTER — Encounter: Payer: Self-pay | Admitting: Emergency Medicine

## 2023-01-17 ENCOUNTER — Ambulatory Visit
Admission: EM | Admit: 2023-01-17 | Discharge: 2023-01-17 | Disposition: A | Discharge status: Home / Self Care | Payer: Commercial Managed Care - PPO | Attending: Internal Medicine | Admitting: Internal Medicine

## 2023-01-17 DIAGNOSIS — J0100 Acute maxillary sinusitis, unspecified: Secondary | ICD-10-CM

## 2023-01-17 MED ORDER — METHYLPREDNISOLONE ACETATE 80 MG/ML IJ SUSP
40.0000 mg | Freq: Once | INTRAMUSCULAR | Status: AC
  Administered 2023-01-17: 40 mg via INTRAMUSCULAR

## 2023-01-17 MED ORDER — AZITHROMYCIN 250 MG PO TABS: ORAL_TABLET | ORAL | 0 refills | Status: AC

## 2023-01-17 NOTE — ED Provider Notes (Signed)
 [facility]  Note:  This document was prepared using Dragon voice recognition software and may include unintentional dictation errors.  MRN: [id number] DOB: Apr 11, 1990 DATE: 01/17/23   Subjective:  Chief Complaint:  Chief Complaint  Patient presents with   Cough   Sinus Congestion    HPI: Lee Hartman is a 33 y.o. male presenting for sinus congestion and sinus pain for the past week and a half. Patient states he started with a scratchy throat and cough over a week ago. He thought he was improving until 5 days ago he started feeling worse. He reports increased sinus pressure and pain. He states he is consistently expressing colored nasal discharge throughout the day. Cough has improved. He has not taken anything for his symptoms. Denies fever, nausea/vomiting, abdominal pain, sore throat, otalgia. Endorses cough, congestion, sinus pressure/pain. Presents NAD.  Prior to Admission medications   Medication Sig Start Date End Date Taking? Authorizing Provider  montelukast (SINGULAIR) 10 MG tablet Take 10 mg by mouth at bedtime.    [provider]     Allergies  Allergen Reactions   Other Other (See Comments)    Blood platelet dissorder   Aspirin Other (See Comments)    H/o thrombocytopenia, avoids NSAIDS also    History:   Past Medical History:  Diagnosis Date   Thrombocytopenia (HCC)      Past Surgical History:  Procedure Laterality Date   ANKLE SURGERY     ORIF ANKLE FRACTURE Left 01/15/2020   Procedure: OPEN TREATMENT OF LEFT TALUS FRACTURE, RESECTION OF TIBIA AND TALUS;  Surgeon: Elsa Lonni SAUNDERS, MD;  Location: [facility];  Service: Orthopedics;  Laterality: Left;  LENGTH OF SURGERY: 90 MIN    Family History  Problem Relation Age of Onset   Migraines Mother    Healthy Father     Social History   Tobacco Use   Smoking status: Never   Smokeless tobacco: Never  Vaping Use   Vaping status: Never Used  Substance Use Topics    Alcohol use: Never   Drug use: Never    Review of Systems  Constitutional:  Negative for fever.  HENT:  Positive for congestion, rhinorrhea, sinus pressure and sinus pain. Negative for ear pain and sore throat.   Respiratory:  Positive for cough.   Gastrointestinal:  Negative for abdominal pain, nausea and vomiting.     Objective:   Vitals: BP 96/64 (BP Location: Right Arm)   Pulse (!) 51   Temp 97.6 F (36.4 C) (Oral)   Resp 18   SpO2 98%   Physical Exam Constitutional:      General: He is not in acute distress.    Appearance: Normal appearance. He is well-developed and normal weight. He is not ill-appearing or toxic-appearing.  HENT:     Head: Normocephalic and atraumatic.     Right Ear: A middle ear effusion is present.     Left Ear: A middle ear effusion is present.     Nose: Rhinorrhea present. Rhinorrhea is clear.     Right Turbinates: Enlarged.     Left Turbinates: Enlarged.     Right Sinus: Maxillary sinus tenderness present.     Left Sinus: Maxillary sinus tenderness present.     Mouth/Throat:     Pharynx: Oropharynx is clear. Uvula midline. No pharyngeal swelling, oropharyngeal exudate or posterior oropharyngeal erythema.     Tonsils: No tonsillar exudate or tonsillar abscesses.  Cardiovascular:     Rate and Rhythm: Normal rate  and regular rhythm.     Heart sounds: Normal heart sounds.  Pulmonary:     Effort: Pulmonary effort is normal.     Breath sounds: Normal breath sounds.     Comments: Clear to auscultation bilaterally  Abdominal:     General: Bowel sounds are normal.     Palpations: Abdomen is soft.     Tenderness: There is no abdominal tenderness.  Skin:    General: Skin is warm and dry.  Neurological:     General: No focal deficit present.     Mental Status: He is alert.  Psychiatric:        Mood and Affect: Mood and affect normal.     Results:  Labs: No results found for this or any previous visit (from the past 24  hours).  Radiology: No results found.   UC Course/Treatments:  Procedures: Procedures   Medications Ordered in UC: Medications  methylPREDNISolone  acetate (DEPO-MEDROL ) injection 40 mg (has no administration in time range)     Assessment and Plan :     ICD-10-CM   1. Acute non-recurrent maxillary sinusitis  J01.00      Acute Non-Recurrent Maxillary Sinusitis Afebrile, nontoxic-appearing, NAD. VSS. DDX includes but not limited to: COVID, flu, viral URI, sinusitis, allergic rhinitis Given worsening sinus pain/congestion, Zithromax  250mg  as directed was prescribed for sinusitis. Recommend patient start decongestants and OTC nasal spray. Patient requesting steroid shot for congestion. Methylprednisolone  40mg  IM was given today in office for congestion. Strict ED precautions were given and patient verbalized understanding.  ED Discharge Orders          Ordered    azithromycin  (ZITHROMAX  Z-PAK) 250 MG tablet        01/17/23 0845             PDMP not reviewed this encounter.     Basilia Ulanda SQUIBB, PA-C 01/17/23 [id number]

## 2023-01-17 NOTE — ED Triage Notes (Signed)
 Pt reports a cough and sinus congestion x 8 days. Denies taking any OTC medication.

## 2023-01-17 NOTE — Discharge Instructions (Signed)
 A prescription was sent for Zithromax . This is an antibiotic used to treat upper respiratory infections. Take as directed. You were given an injection (Methylprednisolone ) for your congestion today. This will help with the pain and inflammation.   Return in 3 to 4 days if no improvement. It is very important for you to pay attention to any new symptoms or worsening of your current condition. Please go directly to the Emergency Department immediately should you begin to have any of the following symptoms: shortness of breath, chest pain or difficulty breathing.
